# Patient Record
Sex: Female | Born: 1990
Health system: Southern US, Community
[De-identification: ages and names within clinical notes are randomized; demographics above are authoritative.]

## PROBLEM LIST (undated history)

## (undated) DIAGNOSIS — D649 Anemia, unspecified: Secondary | ICD-10-CM

## (undated) DIAGNOSIS — J45909 Unspecified asthma, uncomplicated: Secondary | ICD-10-CM

---
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
## 2008-09-13 IMAGING — CR DG CHEST 2V
1 series · 2 of 2 positions shown · non-contrast
Comparison: none

REASON FOR EXAM: Shortness of Breath
COMMENTS:

PROCEDURE:     DXR - DXR CHEST PA (OR AP) AND LATERAL  - March 08, 2006  [DATE]
RESULT:     The lung fields are clear. The heart, mediastinal and osseous
structures are normal in appearance.

[Series 1: view not recorded · 0.17mm/px · 2 of 2 slices shown]
[im 1/2]
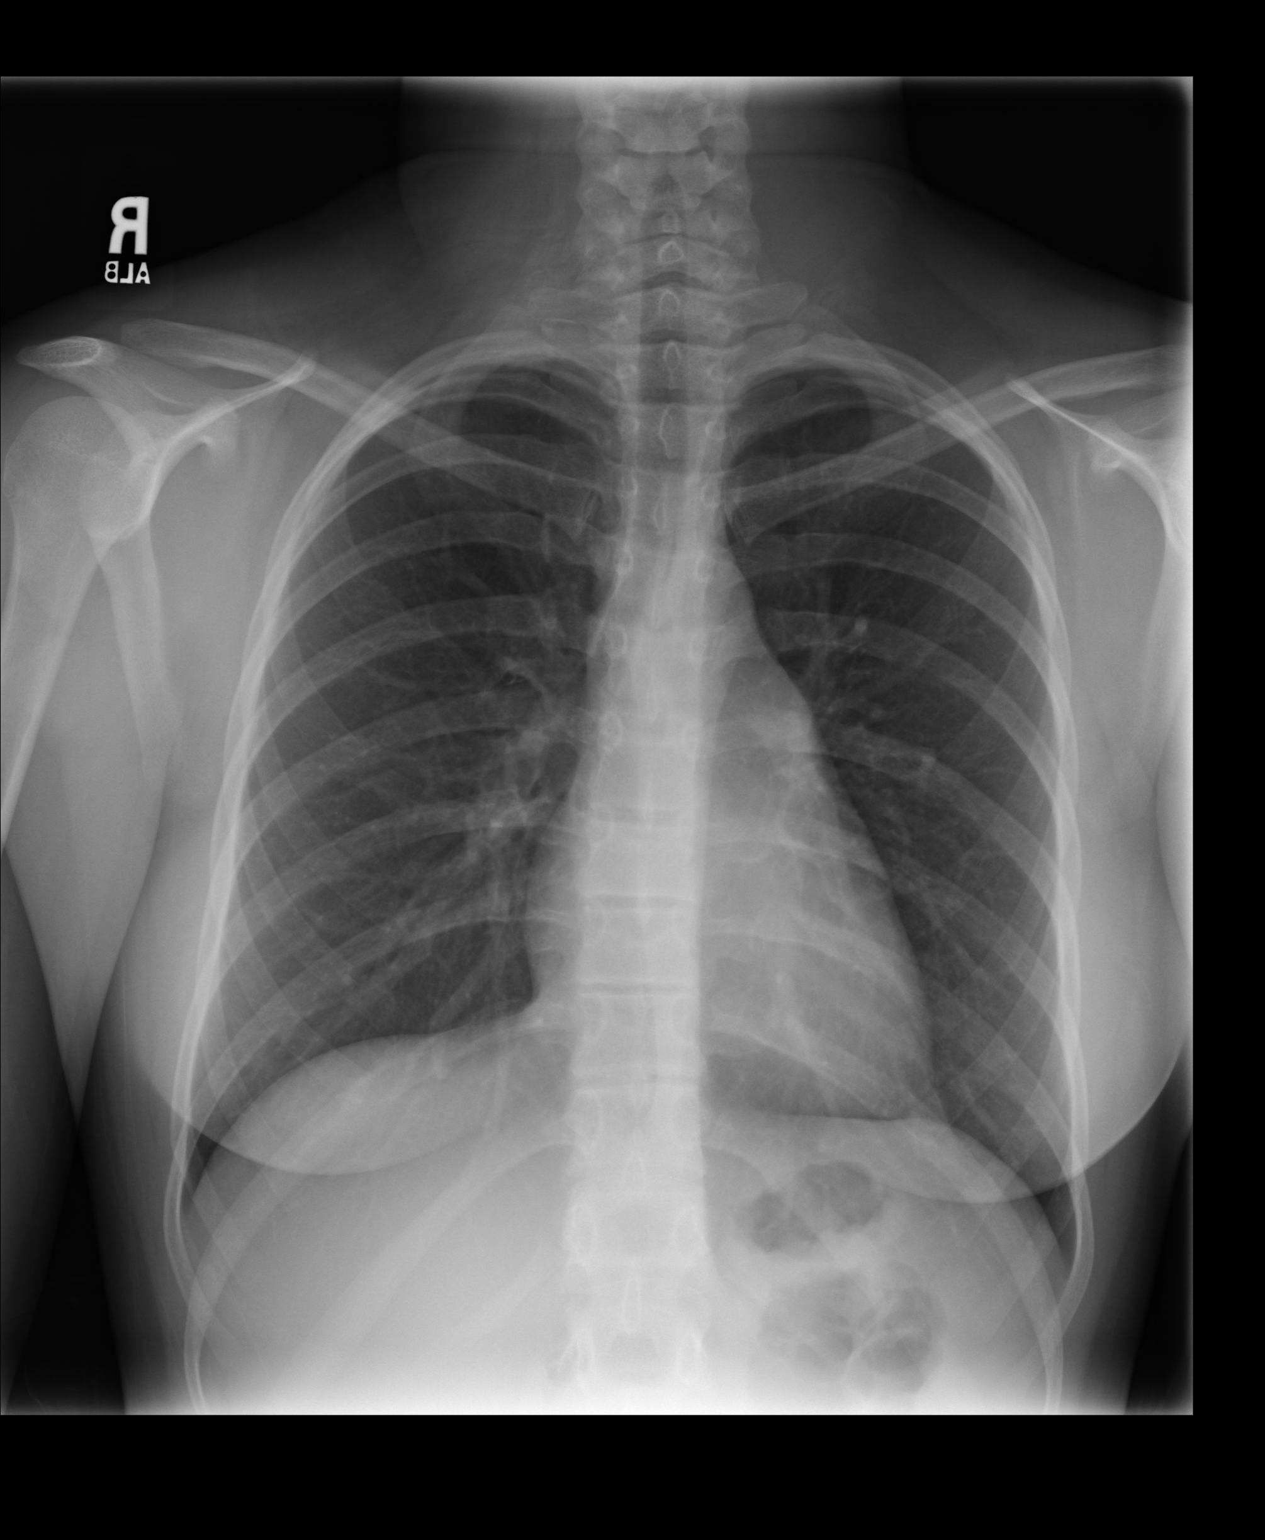
[im 2/2]
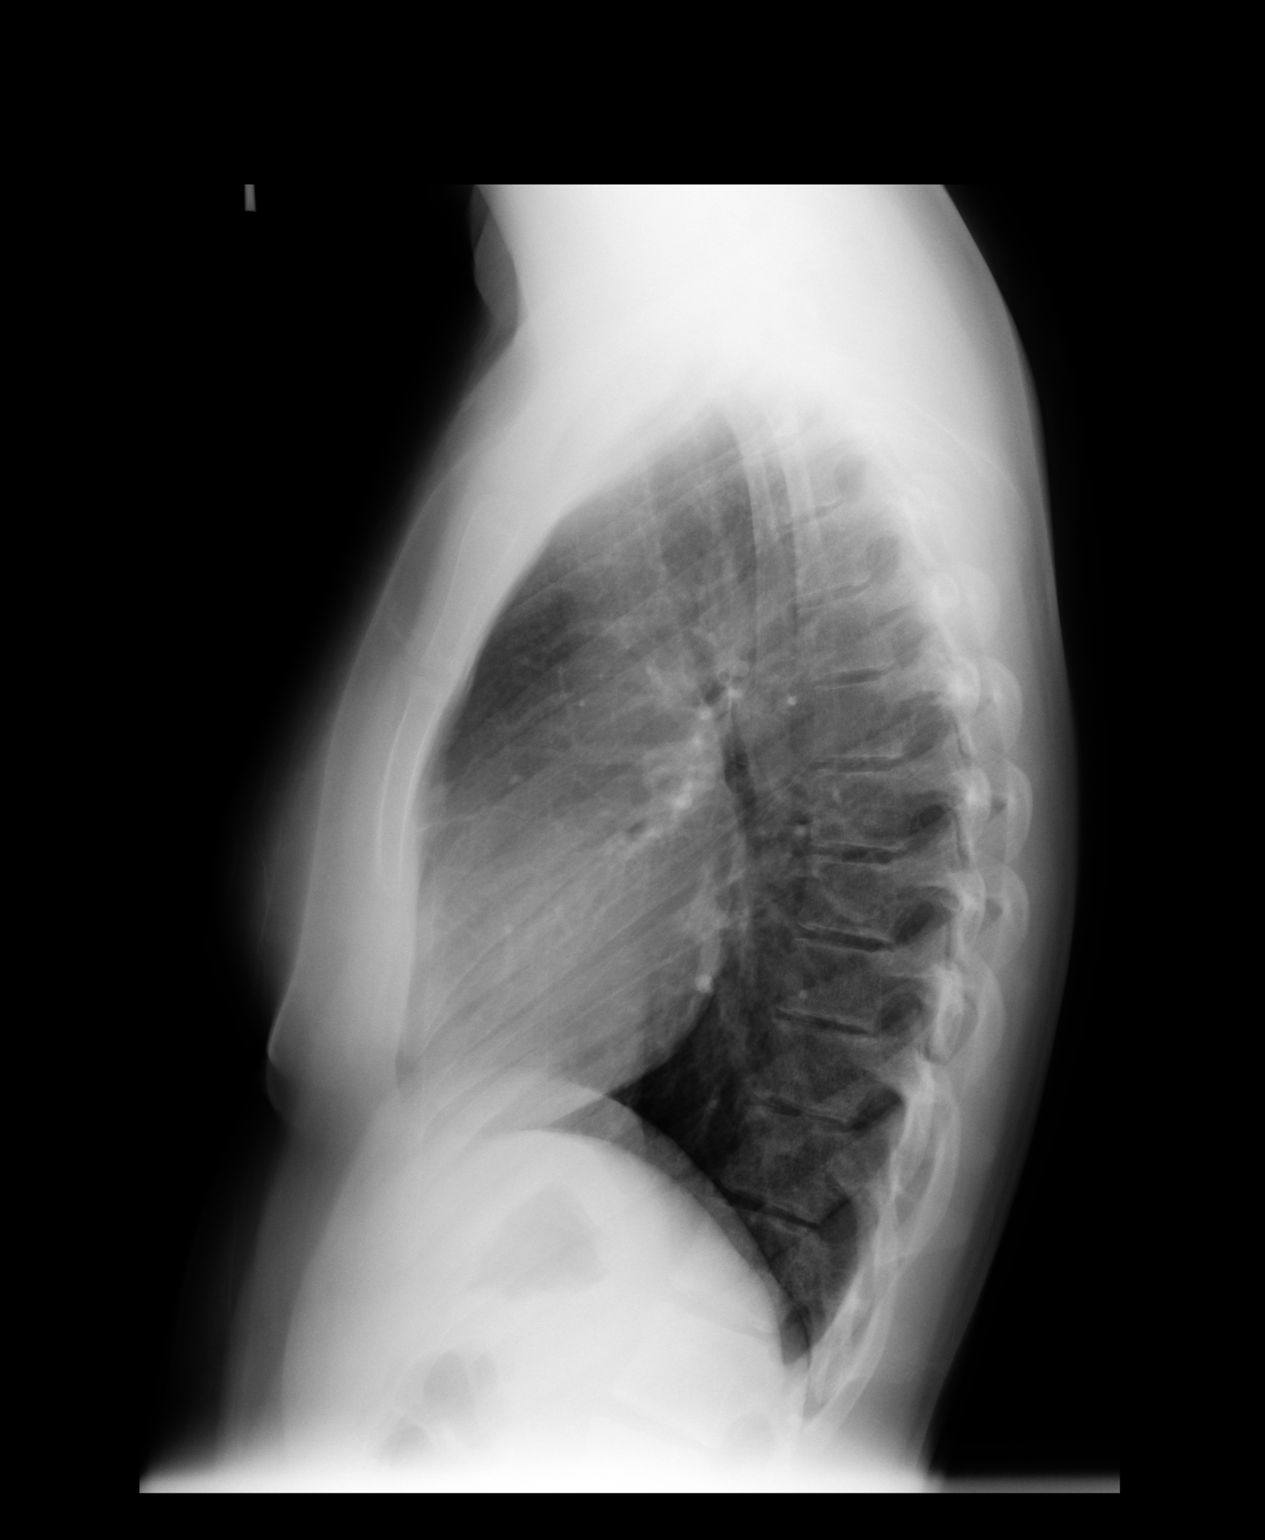

[2 of 2 positions shown; findings below may reference images not displayed]

IMPRESSION: 1)No significant abnormalities are noted.

2)No significant interval changes are seen as compared to the prior exam of
08-23-05.

## 2008-10-27 ENCOUNTER — Emergency Department: Payer: Self-pay | Admitting: Internal Medicine

## 2008-11-21 ENCOUNTER — Emergency Department: Payer: Self-pay | Admitting: Emergency Medicine

## 2013-08-21 ENCOUNTER — Ambulatory Visit: Payer: Self-pay

## 2013-08-21 LAB — RAPID STREP-A WITH REFLX: MICRO TEXT REPORT: NEGATIVE

## 2013-08-23 LAB — BETA STREP CULTURE(ARMC)

## 2020-04-17 ENCOUNTER — Ambulatory Visit: Admission: EM | Admit: 2020-04-17 | Discharge status: Absent without leave | Payer: Self-pay | Source: Home / Self Care

## 2020-04-17 ENCOUNTER — Other Ambulatory Visit: Payer: Self-pay

## 2020-04-17 ENCOUNTER — Ambulatory Visit
Admission: EM | Admit: 2020-04-17 | Discharge: 2020-04-17 | Disposition: A | Discharge status: Home / Self Care | Payer: BLUE CROSS/BLUE SHIELD

## 2020-04-17 ENCOUNTER — Encounter: Payer: Self-pay | Admitting: Internal Medicine

## 2020-04-17 DIAGNOSIS — K047 Periapical abscess without sinus: Secondary | ICD-10-CM | POA: Diagnosis not present

## 2020-04-17 HISTORY — DX: Anemia, unspecified: D64.9

## 2020-04-17 HISTORY — DX: Unspecified asthma, uncomplicated: J45.909

## 2020-04-17 MED ORDER — TRAMADOL HCL 50 MG PO TABS: 50.0000 mg | ORAL_TABLET | Freq: Four times a day (QID) | ORAL | 0 refills | Status: DC | PRN

## 2020-04-17 MED ORDER — TRAMADOL HCL 50 MG PO TABS
50.0000 mg | ORAL_TABLET | Freq: Four times a day (QID) | ORAL | 0 refills | Status: AC | PRN
Start: 2020-04-17 — End: ?

## 2020-04-17 MED ORDER — PENICILLIN V POTASSIUM 500 MG PO TABS
500.0000 mg | ORAL_TABLET | Freq: Four times a day (QID) | ORAL | 0 refills | Status: AC
Start: 2020-04-17 — End: 2020-04-27

## 2020-04-17 NOTE — Discharge Instructions (Addendum)
Follow up with the dentist next week

## 2020-04-17 NOTE — ED Provider Notes (Signed)
[facility]    CSN: [id number] Arrival date & time: 04/17/20  0843      History   Chief Complaint Chief Complaint  Patient presents with  . Dental Pain    HPI Julie Terry is a 29 y.o. female who presents with uppr front gum pain and swelling x 1 week. Has been taking Ibuprofen up to 600 mg which helped initialy, but not any more and cant sleep due to intense pain. She has not been able to afford going to the dentist, but they have the money now.  Is planning to have her teeth extracted and will see a dentist on Monday. Denies fever.     No past medical history on file.  There are no problems to display for this patient.     OB History   No obstetric history on file.      Home Medications    Prior to Admission medications   Not on File    Family History No family history on file.  Social History Social History   Tobacco Use  . Smoking status: Not on file  Substance Use Topics  . Alcohol use: Not on file  . Drug use: Not on file     Allergies   Patient has no allergy information on record.   Review of Systems Review of Systems  Constitutional: Positive for chills. Negative for diaphoresis and fever.  HENT: Positive for dental problem. Negative for congestion and mouth sores.   Respiratory: Negative for cough.   Musculoskeletal: Negative for myalgias.  Skin: Negative for rash.  Neurological: Negative for headaches.  Hematological: Negative for adenopathy.       Her neck  glands feel sore     Physical Exam Triage Vital Signs ED Triage Vitals [04/17/20 0854]  Enc Vitals Group     BP      Pulse      Resp      Temp      Temp src      SpO2      Weight 110 lb (49.9 kg)     Height 5\' 2"  (1.575 m)     Head Circumference      Peak Flow      Pain Score 9     Pain Loc      Pain Edu?      Excl. in GC?    No data found.  Updated Vital Signs Ht 5\' 2"  (1.575 m)   Wt 110 lb (49.9 kg)   BMI 20.12 kg/m   Visual  Acuity Right Eye Distance:   Left Eye Distance:   Bilateral Distance:    Right Eye Near:   Left Eye Near:    Bilateral Near:     Physical Exam HENT:     Mouth/Throat:     Comments: MOUTH- has poor dentition, all her teeth are barely coming out from the gum area, and has an abscess above the L incisor.  Musculoskeletal:     Cervical back: Neck supple.  Lymphadenopathy:     Cervical: Cervical adenopathy present.  Skin:    General: Skin is warm.  Neurological:     Mental Status: She is oriented to person, place, and time.  Psychiatric:        Mood and Affect: Mood normal.        Behavior: Behavior normal.        Thought Content: Thought content normal.        Judgment: Judgment normal.  UC Treatments / Results  Labs (all labs ordered are listed, but only abnormal results are displayed) Labs Reviewed - No data to display  EKG   Radiology No results found.  Procedures Procedures (including critical care time)  Medications Ordered in UC Medications - No data to display  Initial Impression / Assessment and Plan / UC Course  I have reviewed the triage vital signs and the nursing notes. Has dental abscess. I placed her on Penicillin 500 mg qid x 10 days. And gave her Rx for Tramadol 50 mg q 6h prn pain. Needs to FU with a dentist next week.  Final Clinical Impressions(s) / UC Diagnoses   Final diagnoses:  None   Discharge Instructions   None    ED Prescriptions    None     PDMP not reviewed this encounter.   Garey Ham, [place] 04/17/20 [phone]

## 2020-04-17 NOTE — ED Triage Notes (Signed)
Pt presents to Urgent Care with c/o abscess and pain to upper front tooth/teeth x 3 days. Taking ibuprofen w/o relief.
